# Patient Record
Sex: Male | Born: 2005 | Race: Black or African American | Hispanic: No | Marital: Single | Smoking: Never smoker
Health system: Southern US, Community
[De-identification: ages and names within clinical notes are randomized; demographics above are authoritative.]

---
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
## 2024-02-19 ENCOUNTER — Ambulatory Visit (HOSPITAL_COMMUNITY): Admission: EM | Admit: 2024-02-19 | Discharge: 2024-02-19 | Disposition: A | Discharge status: Home / Self Care

## 2024-02-19 ENCOUNTER — Encounter (HOSPITAL_COMMUNITY): Payer: Self-pay | Admitting: Emergency Medicine

## 2024-02-19 DIAGNOSIS — S0502XA Injury of conjunctiva and corneal abrasion without foreign body, left eye, initial encounter: Secondary | ICD-10-CM

## 2024-02-19 MED ORDER — POLYMYXIN B-TRIMETHOPRIM 10000-0.1 UNIT/ML-% OP SOLN: 1.0000 [drp] | OPHTHALMIC | 0 refills | Status: DC

## 2024-02-19 MED ORDER — FLUORESCEIN SODIUM 1 MG OP STRP
ORAL_STRIP | OPHTHALMIC | Status: AC
  Filled 2024-02-19: qty 1

## 2024-02-19 MED ORDER — TETRACAINE HCL 0.5 % OP SOLN
OPHTHALMIC | Status: AC
  Filled 2024-02-19: qty 4

## 2024-02-19 NOTE — ED Triage Notes (Signed)
 Pt got poked in left eye three days ago while playing basketball. Reports initially had trouble seeing. Next day was able to see better. Had headaches that started esp when around bright lights so had to leave work early. When woke up "had eye boogers". Reports still having light sensitivity. Reports has swelling to lower part of eye lid.

## 2024-02-19 NOTE — ED Provider Notes (Signed)
 [facility]    CSN: [id number] Arrival date & time: 02/19/24  1411      History   Chief Complaint Chief Complaint  Patient presents with   Eye Injury    HPI Tony Bryant is a 18 y.o. male.   Patient is a 18 year old male who presents to the urgent care today with concerns of left eye pain after trauma to the left eye.  He reports that 3 days ago while playing basketball, he was accidentally poked in the left eye.  He reports having a headache and decreased vision out of that eye but reports that all of his symptoms have been improving since then.  He no longer has the headache and only has a slight fuzziness of the left eye.  He has not been any eyedrops in or tried any medications to help with the symptoms.  He does not wear contacts or glasses.  He denies any fever, drainage from the eye, pain with extraocular movement, loss of consciousness, dizziness, neurologic changes, or other concerns at this time.       History reviewed. No pertinent past medical history.  There are no active problems to display for this patient.   History reviewed. No pertinent surgical history.    Home Medications    Prior to Admission medications   Medication Sig Start Date End Date Taking? Authorizing Provider  trimethoprim-polymyxin b (POLYTRIM) ophthalmic solution Place 1 drop into the left eye every 3 (three) hours while awake. 02/19/24  Yes Gifford Shave, PA-C  cetirizine (ZYRTEC) 10 MG chewable tablet Chew 1 tablet (10 mg total) by mouth daily. 04/10/19   McDonald, Mia A, PA-C    Family History No family history on file.  Social History Social History   Tobacco Use   Smoking status: Never   Smokeless tobacco: Never  Substance Use Topics   Alcohol use: No     Allergies   Patient has no known allergies.   Review of Systems Review of Systems See HPI for relevant ROS.  Physical Exam Triage Vital Signs ED Triage Vitals  Encounter Vitals Group      BP 02/19/24 1440 (!) 141/81     Systolic BP Percentile --      Diastolic BP Percentile --      Pulse Rate 02/19/24 1440 (!) 59     Resp 02/19/24 1440 13     Temp 02/19/24 1440 98 F (36.7 C)     Temp Source 02/19/24 1440 Oral     SpO2 02/19/24 1440 99 %     Weight --      Height --      Head Circumference --      Peak Flow --      Pain Score 02/19/24 1439 5     Pain Loc --      Pain Education --      Exclude from Growth Chart --    No data found.  Updated Vital Signs BP (!) 141/81 (BP Location: Left Arm)   Pulse (!) 59   Temp 98 F (36.7 C) (Oral)   Resp 13   SpO2 99%   Visual Acuity Right Eye Distance: 20/20 Left Eye Distance: 20/25 Bilateral Distance: 20/20  Right Eye Near:   Left Eye Near:    Bilateral Near:     Physical Exam General: Alert and oriented, well-developed/well-nourished, calm, cooperative, no acute distress HEENT: Normocephalic atraumatic, moist mucous membranes, no scleral icterus, trachea midline, no pain with extraocular movement,  PERRL Lungs: Speaking full sentences, non-labored respirations, no distress Abdomen:  Soft, nondistended Musculoskeletal: Moves all extremities well Neurologic: Awake, A&O x4, gait normal Integumentary: Warm, dry, normal for ethnicity, intact, no rash Psychiatric: Appropriate mood & affect  UC Treatments / Results  Labs (all labs ordered are listed, but only abnormal results are displayed) Labs Reviewed - No data to display  EKG   Radiology No results found.  Procedures Procedures (including critical care time)  Medications Ordered in UC Medications - No data to display  Initial Impression / Assessment and Plan / UC Course  I have reviewed the triage vital signs and the nursing notes.  Pertinent labs & imaging results that were available during my care of the patient were reviewed by me and considered in my medical decision making (see chart for details).  Presents with left eye pain.  Differential  Diagnosis: Bacterial conjunctivitis, allergic conjunctivitis, viral conjunctivitis, corneal abrasion, blepharitis, keratitis, uveitis, acute closure glaucoma, preseptal cellulitis, orbital cellulitis, including other diagnoses.  Rationale: Patient presents with left eye pain. No recent eye trauma or suspected microtrauma (dust, sand, etc). Negative Seidel sign.  Patient does have a corneal abrasion of the left eye. Patient does not wear contacts. No significant photophobia.  Prescribed patient polymyxin feeding trimethoprim eyedrops.  Recommended patient follow-up with their primary care provider or eye doctor in the next several days.  Discussed return precautions to the urgent care or emergency department including if patient develops fever, swelling of the eyelid, pain with extraocular movement, neurologic symptoms, worsening of symptoms, or if they have any other concerns.  Disposition: Stable to discharge home.   All questions answered to the best of this examiner's ability. Reviewed possible severe sequelae and other reasons to return to urgent care or ED for further evaluation and/or treatment. Advised to f/u PCP w/in 48 to 72 hours for further eval and/or reassessment. Patient voices understanding of the above and agrees to plan.  An appropriate evaluation has been performed, and in my medical judgment there is currently no evidence of an immediate life-threatening or surgical condition. Discharge is therefore indicated at this time.  This document was created using the aid of voice recognition Scientist, clinical (histocompatibility and immunogenetics).  Final Clinical Impressions(s) / UC Diagnoses   Final diagnoses:  Abrasion of left cornea, initial encounter     Discharge Instructions      We have sent in an eyedrop to help with the eye.  Recommend following up with your primary care provider or ophthalmologist in the next several days if symptoms are not improving.  Please return to the emergency department if you  have any loss of vision, fever, worsening of symptoms, or if you have any other concerns.   ED Prescriptions     Medication Sig Dispense Auth. Provider   trimethoprim-polymyxin b (POLYTRIM) ophthalmic solution Place 1 drop into the left eye every 3 (three) hours while awake. 10 mL Gifford Shave, PA-C      PDMP not reviewed this encounter.   Gifford Shave, PA-C 02/19/24 [phone]

## 2024-02-19 NOTE — Discharge Instructions (Addendum)
 We have sent in an eyedrop to help with the eye.  Recommend following up with your primary care provider or ophthalmologist in the next several days if symptoms are not improving.  Please return to the emergency department if you have any loss of vision, fever, worsening of symptoms, or if you have any other concerns.

## 2024-08-05 ENCOUNTER — Encounter (HOSPITAL_COMMUNITY): Payer: Self-pay

## 2024-08-05 ENCOUNTER — Ambulatory Visit (HOSPITAL_COMMUNITY)
Admission: EM | Admit: 2024-08-05 | Discharge: 2024-08-05 | Disposition: A | Discharge status: Home / Self Care | Attending: Emergency Medicine | Admitting: Emergency Medicine

## 2024-08-05 DIAGNOSIS — R369 Urethral discharge, unspecified: Secondary | ICD-10-CM | POA: Diagnosis not present

## 2024-08-05 DIAGNOSIS — Z113 Encounter for screening for infections with a predominantly sexual mode of transmission: Secondary | ICD-10-CM | POA: Diagnosis present

## 2024-08-05 DIAGNOSIS — R300 Dysuria: Secondary | ICD-10-CM | POA: Insufficient documentation

## 2024-08-05 LAB — HIV ANTIBODY (ROUTINE TESTING W REFLEX): HIV Screen 4th Generation wRfx: NONREACTIVE

## 2024-08-05 LAB — RPR: RPR Ser Ql: NONREACTIVE

## 2024-08-05 LAB — POCT URINALYSIS DIP (MANUAL ENTRY)
Bilirubin, UA: NEGATIVE
Blood, UA: NEGATIVE
Glucose, UA: NEGATIVE mg/dL
Ketones, POC UA: NEGATIVE mg/dL
Nitrite, UA: NEGATIVE
Protein Ur, POC: 100 mg/dL — AB
Spec Grav, UA: 1.025 (ref 1.010–1.025)
Urobilinogen, UA: 0.2 U/dL
pH, UA: 6 (ref 5.0–8.0)

## 2024-08-05 MED ORDER — CEFTRIAXONE SODIUM 500 MG IJ SOLR
INTRAMUSCULAR | Status: AC
  Filled 2024-08-05: qty 500

## 2024-08-05 MED ORDER — LIDOCAINE HCL (PF) 1 % IJ SOLN
INTRAMUSCULAR | Status: AC
  Filled 2024-08-05: qty 2

## 2024-08-05 MED ORDER — DOXYCYCLINE HYCLATE 100 MG PO TABS: 100.0000 mg | ORAL_TABLET | Freq: Two times a day (BID) | ORAL | 0 refills | Status: AC

## 2024-08-05 MED ORDER — CEFTRIAXONE SODIUM 500 MG IJ SOLR
500.0000 mg | INTRAMUSCULAR | Status: DC
  Administered 2024-08-05: 500 mg via INTRAMUSCULAR

## 2024-08-05 NOTE — ED Triage Notes (Signed)
 Patient having discomfort with urination, groin itching. Also having testicular discomfort with intercourse and slight penile discharge. Ongoing for 4-5 months. No known exposure.   Denies any current rash, discoloration, or swelling.

## 2024-08-05 NOTE — ED Provider Notes (Signed)
 [facility]    CSN: [id number] Arrival date & time: 08/05/24  0803      History   Chief Complaint Chief Complaint  Patient presents with   Dysuria   Penile Discharge    HPI Tony Bryant is a 18 y.o. male.   Patient presents to clinic over concern of dysuria, penile discharge and feeling intermittent scrotal swelling for the past 4 to 5 months.  Last unprotected intercourse a few months prior.  Has not had abdominal pain, nausea, vomiting or fevers.  Denies penile sores or lesions.  Without recent STI screening.  Would like HIV and syphilis screening today.  The history is provided by the patient and medical records.  Dysuria Penile Discharge    History reviewed. No pertinent past medical history.  There are no active problems to display for this patient.   History reviewed. No pertinent surgical history.     Home Medications    Prior to Admission medications   Medication Sig Start Date End Date Taking? Authorizing Provider  doxycycline  (VIBRA -TABS) 100 MG tablet Take 1 tablet (100 mg total) by mouth 2 (two) times daily for 7 days. 08/05/24 08/12/24 Yes Dreama, Rupa Lagan  N, FNP    Family History History reviewed. No pertinent family history.  Social History Social History   Tobacco Use   Smoking status: Never   Smokeless tobacco: Never  Vaping Use   Vaping status: Never Used  Substance Use Topics   Alcohol use: Not Currently   Drug use: Never     Allergies   Kiwi extract   Review of Systems Review of Systems  Per HPI  Physical Exam Triage Vital Signs ED Triage Vitals  Encounter Vitals Group     BP 08/05/24 0831 133/63     Girls Systolic BP Percentile --      Girls Diastolic BP Percentile --      Boys Systolic BP Percentile --      Boys Diastolic BP Percentile --      Pulse Rate 08/05/24 0831 (!) 52     Resp 08/05/24 0831 18     Temp 08/05/24 0831 98.2 F (36.8 C)     Temp Source 08/05/24 0831 Oral     SpO2  08/05/24 0831 98 %     Weight 08/05/24 0830 125 lb (56.7 kg)     Height 08/05/24 0830 5' 3 (1.6 m)     Head Circumference --      Peak Flow --      Pain Score 08/05/24 0830 0     Pain Loc --      Pain Education --      Exclude from Growth Chart --    No data found.  Updated Vital Signs BP 133/63 (BP Location: Right Arm)   Pulse (!) 52   Temp 98.2 F (36.8 C) (Oral)   Resp 18   Ht 5' 3 (1.6 m)   Wt 125 lb (56.7 kg)   SpO2 98%   BMI 22.14 kg/m   Visual Acuity Right Eye Distance:   Left Eye Distance:   Bilateral Distance:    Right Eye Near:   Left Eye Near:    Bilateral Near:     Physical Exam Vitals and nursing note reviewed. Exam conducted with a chaperone present.  Constitutional:      General: He is not in acute distress.    Appearance: Normal appearance. He is well-developed.  HENT:     Head: Normocephalic and atraumatic.  Right Ear: External ear normal.     Left Ear: External ear normal.     Nose: Nose normal.     Mouth/Throat:     Mouth: Mucous membranes are moist.  Eyes:     General: No scleral icterus. Cardiovascular:     Rate and Rhythm: Normal rate.  Pulmonary:     Effort: Pulmonary effort is normal. No respiratory distress.  Genitourinary:    Pubic Area: No rash.      Penis: Normal and circumcised.      Testes: Normal.        Right: Mass, tenderness or swelling not present.        Left: Mass, tenderness or swelling not present.  Musculoskeletal:     Cervical back: Neck supple.  Skin:    General: Skin is warm and dry.  Neurological:     General: No focal deficit present.     Mental Status: He is alert and oriented to person, place, and time.  Psychiatric:        Mood and Affect: Mood normal.        Behavior: Behavior normal.      UC Treatments / Results  Labs (all labs ordered are listed, but only abnormal results are displayed) Labs Reviewed  POCT URINALYSIS DIP (MANUAL ENTRY) - Abnormal; Notable for the following components:       Result Value   Clarity, UA cloudy (*)    Protein Ur, POC =100 (*)    Leukocytes, UA Trace (*)    All other components within normal limits  RPR  HIV ANTIBODY (ROUTINE TESTING W REFLEX)  CYTOLOGY, (ORAL, ANAL, URETHRAL) ANCILLARY ONLY    EKG   Radiology No results found.  Procedures Procedures (including critical care time)  Medications Ordered in UC Medications  cefTRIAXone  (ROCEPHIN ) injection 500 mg (has no administration in time range)    Initial Impression / Assessment and Plan / UC Course  I have reviewed the triage vital signs and the nursing notes.  Pertinent labs & imaging results that were available during my care of the patient were reviewed by me and considered in my medical decision making (see chart for details).  Vitals and triage reviewed, patient is hemodynamically stable.  Penile discharge after unprotected intercourse, cytology swab obtained as well as HIV and syphilis.  Will treat empirically for gonorrhea and chlamydia.  UA with trace leukocytes, lower concern for UTI at this time.  Plan of care, follow-up care return precautions given, no questions at this time.    Final Clinical Impressions(s) / UC Diagnoses   Final diagnoses:  Penile discharge  Encounter for screening examination for sexually transmitted infection     Discharge Instructions      Today have been screened for sexually transmitted infections.  We have treated you empirically for gonorrhea and chlamydia.  Our staff will contact you if additional treatment is needed and results will be available via MyChart.  Our staff does not call for normal results.  Abstain from intercourse for at least a week after completing treatment.  Return to clinic for any new or urgent symptoms.     ED Prescriptions     Medication Sig Dispense Auth. Provider   doxycycline  (VIBRA -TABS) 100 MG tablet Take 1 tablet (100 mg total) by mouth 2 (two) times daily for 7 days. 14 tablet Dreama,  Arnet Hofferber  N, FNP      PDMP not reviewed this encounter.   Dreama, Simon Aaberg  N, FNP 08/05/24 [phone]

## 2024-08-05 NOTE — Discharge Instructions (Addendum)
 Today have been screened for sexually transmitted infections.  We have treated you empirically for gonorrhea and chlamydia.  Our staff will contact you if additional treatment is needed and results will be available via MyChart.  Our staff does not call for normal results.  Abstain from intercourse for at least a week after completing treatment.  Return to clinic for any new or urgent symptoms.

## 2024-08-06 ENCOUNTER — Ambulatory Visit (HOSPITAL_COMMUNITY): Payer: Self-pay

## 2024-08-06 LAB — CYTOLOGY, (ORAL, ANAL, URETHRAL) ANCILLARY ONLY
Chlamydia: POSITIVE — AB
Comment: NEGATIVE
Comment: NEGATIVE
Comment: NORMAL
Neisseria Gonorrhea: POSITIVE — AB
Trichomonas: NEGATIVE
